# Patient Record
Sex: Female | Born: 1970 | Race: White | Hispanic: No | Marital: Married | Smoking: Never smoker
Health system: Southern US, Community
[De-identification: ages and names within clinical notes are randomized; demographics above are authoritative.]

## PROBLEM LIST (undated history)

## (undated) DIAGNOSIS — C801 Malignant (primary) neoplasm, unspecified: Secondary | ICD-10-CM

---

## 2003-01-23 HISTORY — PX: AUGMENTATION MAMMAPLASTY: SUR837

## 2010-02-10 ENCOUNTER — Emergency Department: Payer: Self-pay | Admitting: Internal Medicine

## 2012-12-18 DIAGNOSIS — R1031 Right lower quadrant pain: Secondary | ICD-10-CM | POA: Insufficient documentation

## 2012-12-18 DIAGNOSIS — J329 Chronic sinusitis, unspecified: Secondary | ICD-10-CM | POA: Insufficient documentation

## 2013-01-02 DIAGNOSIS — N920 Excessive and frequent menstruation with regular cycle: Secondary | ICD-10-CM | POA: Insufficient documentation

## 2013-01-02 DIAGNOSIS — R350 Frequency of micturition: Secondary | ICD-10-CM | POA: Insufficient documentation

## 2014-11-29 ENCOUNTER — Other Ambulatory Visit: Payer: Self-pay | Admitting: Family

## 2014-11-29 DIAGNOSIS — Z1231 Encounter for screening mammogram for malignant neoplasm of breast: Secondary | ICD-10-CM

## 2014-12-01 ENCOUNTER — Ambulatory Visit
Admission: RE | Admit: 2014-12-01 | Discharge: 2014-12-01 | Disposition: A | Discharge status: Home / Self Care | Payer: BLUE CROSS/BLUE SHIELD | Source: Ambulatory Visit | Attending: Family | Admitting: Family

## 2014-12-01 DIAGNOSIS — Z1231 Encounter for screening mammogram for malignant neoplasm of breast: Secondary | ICD-10-CM | POA: Diagnosis present

## 2014-12-01 HISTORY — DX: Malignant (primary) neoplasm, unspecified: C80.1

## 2015-10-19 DIAGNOSIS — E041 Nontoxic single thyroid nodule: Secondary | ICD-10-CM | POA: Insufficient documentation

## 2016-02-22 DIAGNOSIS — F39 Unspecified mood [affective] disorder: Secondary | ICD-10-CM | POA: Insufficient documentation

## 2016-08-29 ENCOUNTER — Encounter: Payer: Self-pay | Admitting: Sports Medicine

## 2016-08-29 ENCOUNTER — Ambulatory Visit (INDEPENDENT_AMBULATORY_CARE_PROVIDER_SITE_OTHER): Payer: BLUE CROSS/BLUE SHIELD | Admitting: Sports Medicine

## 2016-08-29 ENCOUNTER — Ambulatory Visit (INDEPENDENT_AMBULATORY_CARE_PROVIDER_SITE_OTHER): Payer: BLUE CROSS/BLUE SHIELD

## 2016-08-29 VITALS — Ht 61.0 in | Wt 150.0 lb

## 2016-08-29 DIAGNOSIS — S99922A Unspecified injury of left foot, initial encounter: Secondary | ICD-10-CM

## 2016-08-29 DIAGNOSIS — M79672 Pain in left foot: Secondary | ICD-10-CM | POA: Diagnosis not present

## 2016-08-29 DIAGNOSIS — M722 Plantar fascial fibromatosis: Secondary | ICD-10-CM

## 2016-08-29 MED ORDER — MELOXICAM 15 MG PO TABS: 15.0000 mg | ORAL_TABLET | Freq: Every day | ORAL | 0 refills | Status: DC

## 2016-08-29 NOTE — Progress Notes (Signed)
   Subjective:    Patient ID: Virginia Macias, female    DOB: 10-10-1970, 46 y.o.   MRN: [id number]  HPI  I dropped a chair on my left foot about 3 months ago then about 2 months later started having a lot of pain in the arch     Review of Systems  All other systems reviewed and are negative.      Objective:   Physical Exam        Assessment & Plan:

## 2016-08-29 NOTE — Patient Instructions (Signed)
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 Do exercises exactly as told by your health care provider and adjust them as directed. It is normal to feel mild stretching, pulling, tightness, or discomfort as you do these exercises, but you should stop right away if you feel sudden pain or your pain gets worse. Do not begin these exercises until told by your health care provider. Stretching and range of motion exercises These exercises warm up your muscles and joints and improve the movement and flexibility of your foot. These exercises also help to relieve pain. Exercise A: Plantar fascia stretch  1. Sit with your left / right leg crossed over your opposite knee. 2. Hold your heel with one hand with that thumb near your arch. With your other hand, hold your toes and gently pull them back toward the top of your foot. You should feel a stretch on the bottom of your toes or your foot or both. 3. Hold this stretch for__________ seconds. 4. Slowly release your toes and return to the starting position. Repeat __________ times. Complete this exercise __________ times a day. Exercise B: Gastroc, standing  1. Stand with your hands against a wall. 2. Extend your left / right leg behind you, and bend your front knee slightly. 3. Keeping your heels on the floor and keeping your back knee straight, shift your weight toward the wall without arching your back. You should feel a gentle stretch in your left / right calf. 4. Hold this position for __________ seconds. Repeat __________ times. Complete  this exercise __________ times a day. Exercise C: Soleus, standing 1. Stand with your hands against a wall. 2. Extend your left / right leg behind you, and bend your front knee slightly. 3. Keeping your heels on the floor, bend your back knee and slightly shift your weight over the back leg. You should feel a gentle stretch deep in your calf. 4. Hold this position for __________ seconds. Repeat __________ times. Complete this exercise __________ times a day. Exercise D: Gastrocsoleus, standing 1. Stand with the ball of your left / right foot on a step. The ball of your foot is on the walking surface, right under your toes. 2. Keep your other foot firmly on the same step. 3. Hold onto the wall or a railing for balance. 4. Slowly lift your other foot, allowing your body weight to press your heel down over the edge of the step. You should feel a stretch in your left / right calf. 5. Hold this position for __________ seconds. 6. Return both feet to the step. 7. Repeat this exercise with a slight bend in your left / right knee. Repeat __________ times with your left / right knee straight and __________ times with your left / right knee bent. Complete this exercise __________ times a day. Balance exercise This exercise builds your balance and strength control of your arch to help take pressure off your plantar fascia. Exercise E: Single leg stand 1. Without shoes, stand near a railing or in a doorway. You may hold onto  the railing or door frame as needed. 2. Stand on your left / right foot. Keep your big toe down on the floor and try to keep your arch lifted. Do not let your foot roll inward. 3. Hold this position for __________ seconds. 4. If this exercise is too easy, you can try it with your eyes closed or while standing on a pillow. Repeat __________ times. Complete this exercise __________ times a day. This information is not intended to replace advice given to you by your health care  provider. Make sure you discuss any questions you have with your health care provider. Document Released: 01/08/2005 Document Revised: 09/13/2015 Document Reviewed: 11/22/2014 Elsevier Interactive Patient Education  2018 [employer].

## 2016-08-29 NOTE — Progress Notes (Signed)
Subjective: Virginia Macias is a 46 y.o. female patient presents to office with complaint of heel pain on the left. Patient admits to post static dyskinesia for 1 month in duration. Patient states that this started after she dropped a chair on her foot. 3 months ago at her toe joint when this area is feeling better. However, has some pain back towards her heel. Patient has treated this problem with changing shoes without complete relief. Denies any other pedal complaints.   There are no active problems to display for this patient.   No current outpatient prescriptions on file prior to visit.   No current facility-administered medications on file prior to visit.     Allergies  Allergen Reactions  . Morphine Other (See Comments)    Migraine    Objective: Physical Exam General: The patient is alert and oriented x3 in no acute distress.  Dermatology: Skin is warm, dry and supple bilateral lower extremities. Nails 1-10 are normal. There is no erythema, edema, no eccymosis, no open lesions present. Integument is otherwise unremarkable.  Vascular: Dorsalis Pedis pulse and Posterior Tibial pulse are 2/4 bilateral. Capillary fill time is immediate to all digits.  Neurological: Grossly intact to light touch with an achilles reflex of +2/5 and a  negative Tinel's sign bilateral.  Musculoskeletal: No tenderness to left first metatarsophalangeal joint area where she dropped a chair. There is mild Tenderness to palpation at the medial calcaneal tubercale and through the insertion of the plantar fascia on the left foot specifically asked tendon starts to insert on the heel. No pain with compression of calcaneus bilateral. No pain with tuning fork to calcaneus bilateral. No pain with calf compression bilateral. There is decreased Ankle joint range of motion bilateral. All other joints range of motion within normal limits bilateral. Strength 5/5 in all groups bilateral.   Gait: Unassisted,  Antalgic avoid weight on left heel  Xray, Left foot:  Normal osseous mineralization. Joint spaces preserved. No fracture/dislocation/boney destruction. Minimal posterior Calcaneal spur present with mild thickening of plantar fascia. No other soft tissue abnormalities or radiopaque foreign bodies.   Assessment and Plan: Problem List Items Addressed This Visit    None    Visit Diagnoses    Plantar fasciitis    -  Primary   Relevant Medications   meloxicam (MOBIC) 15 MG tablet   Other Relevant Orders   DG Foot Complete Left   Injury of toe on left foot, initial encounter       Left foot pain          -Complete examination performed.  -Xrays reviewed -Discussed with patient in detail the condition of plantar fasciitis, Which likely came from compensating after she injured her foot with the chair 3 months prior, how this occurs and general treatment options. Explained both conservative and surgical treatments.  -Patient declined injection at this time -Rx Meloxicam to take with food or with GERD medicines -Recommended good supportive shoes and advised use of OTC insert. Explained to patient that if these orthoses work well, we will continue with these. If these do not improve her condition and  pain, we will consider custom molded orthoses. Shoe list was provided. -Explained and dispensed to patient daily stretching exercises. -Recommend patient to ice affected area 1-2x daily. -Patient to return to office as needed for follow up or sooner if problems or questions arise.  Landis Martins, DPM

## 2016-10-10 DIAGNOSIS — Z8249 Family history of ischemic heart disease and other diseases of the circulatory system: Secondary | ICD-10-CM | POA: Insufficient documentation

## 2017-05-08 ENCOUNTER — Ambulatory Visit: Payer: BLUE CROSS/BLUE SHIELD | Admitting: Sports Medicine

## 2017-06-20 IMAGING — MG MM DIGITAL SCREENING W/ IMPLANTS BILATERAL
9 series · 9 of 9 positions shown · non-contrast
Comparison: None

CLINICAL DATA: Screening.

EXAM:
DIGITAL SCREENING BILATERAL MAMMOGRAM WITH IMPLANTS AND CAD
The patient has retropectoral implants. Standard and implant
displaced views were performed.

[L MLO (1 of 2)]
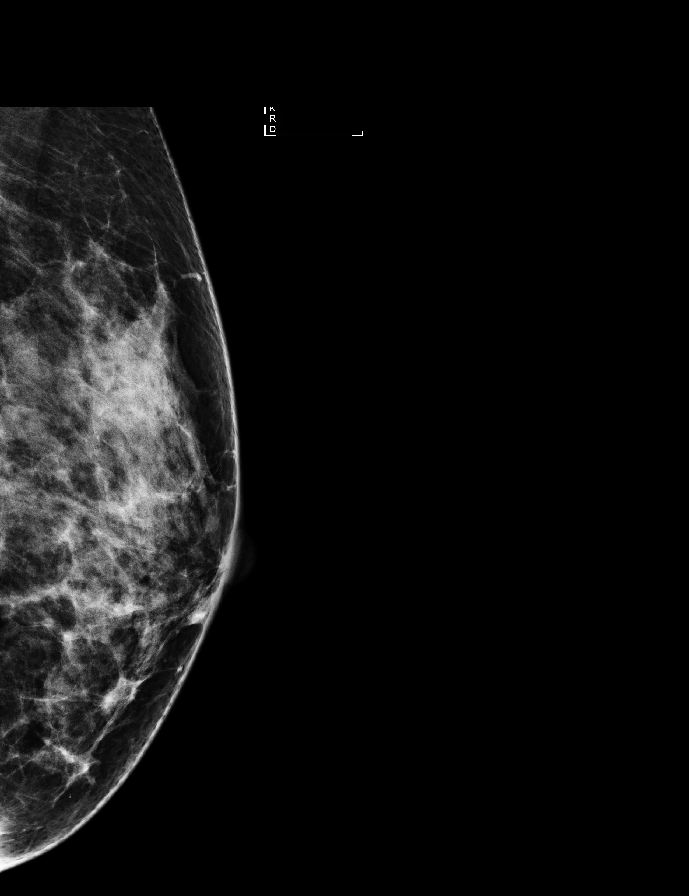

[L CC (1 of 2)]
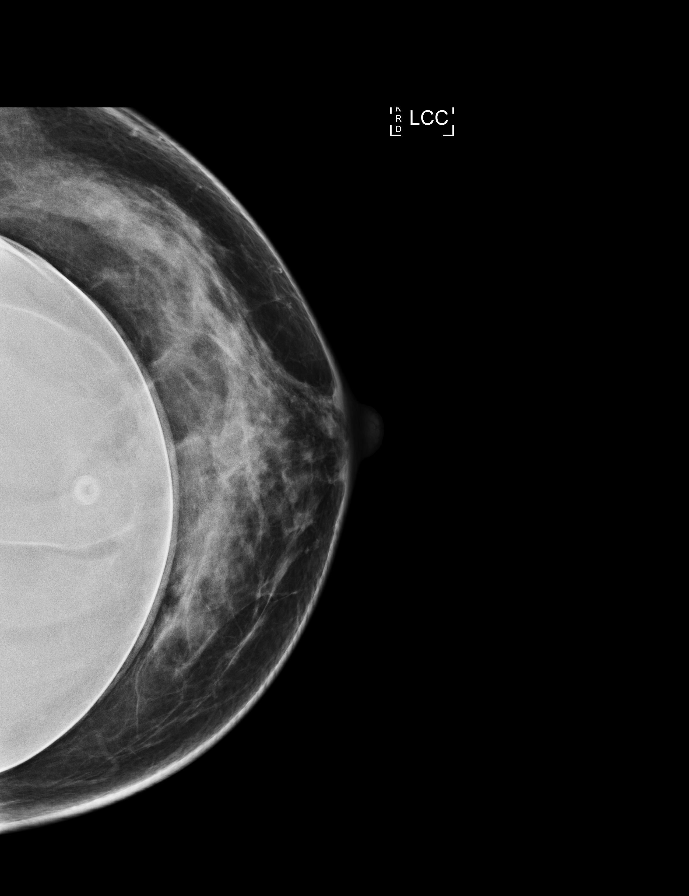

[L CC (2 of 2)]
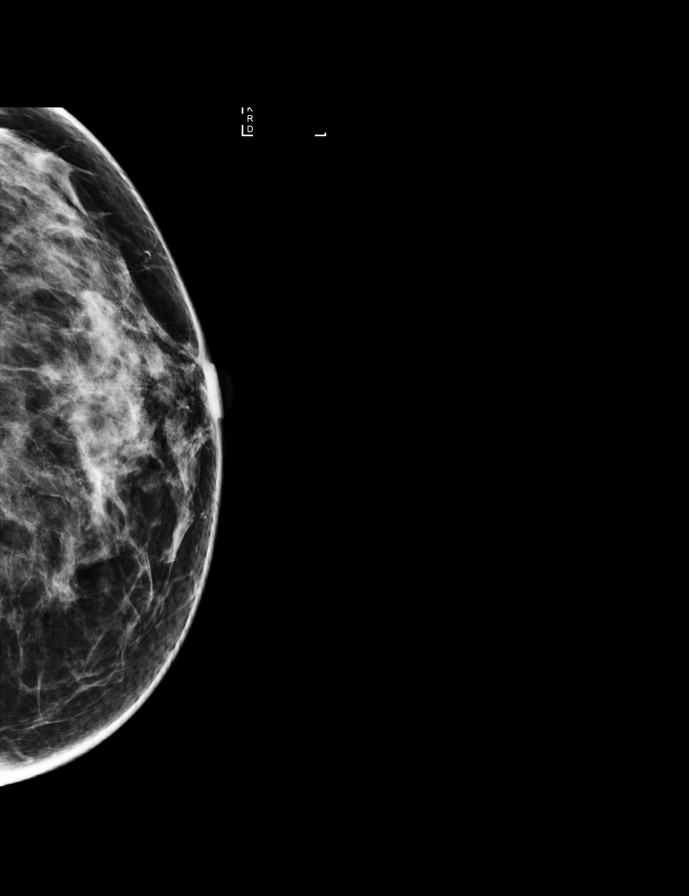

[R MLO (1 of 3)]
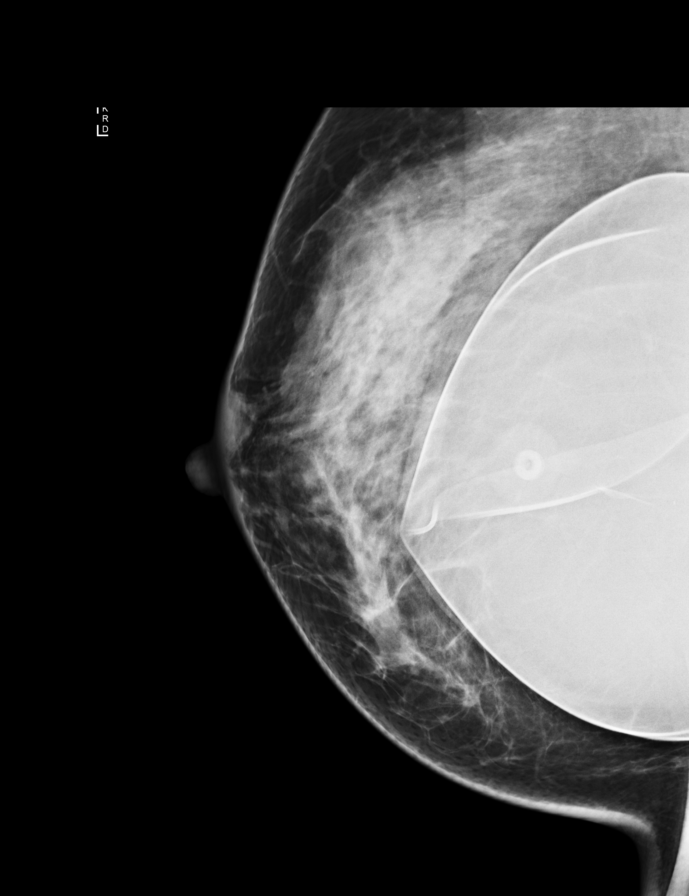

[L MLO (2 of 2)]
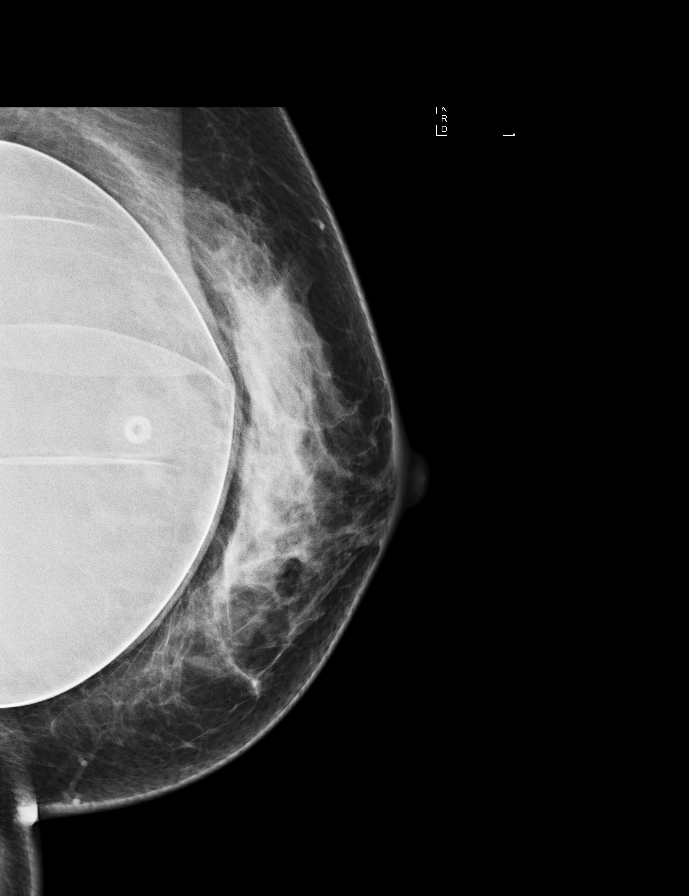

[R CC (1 of 2)]
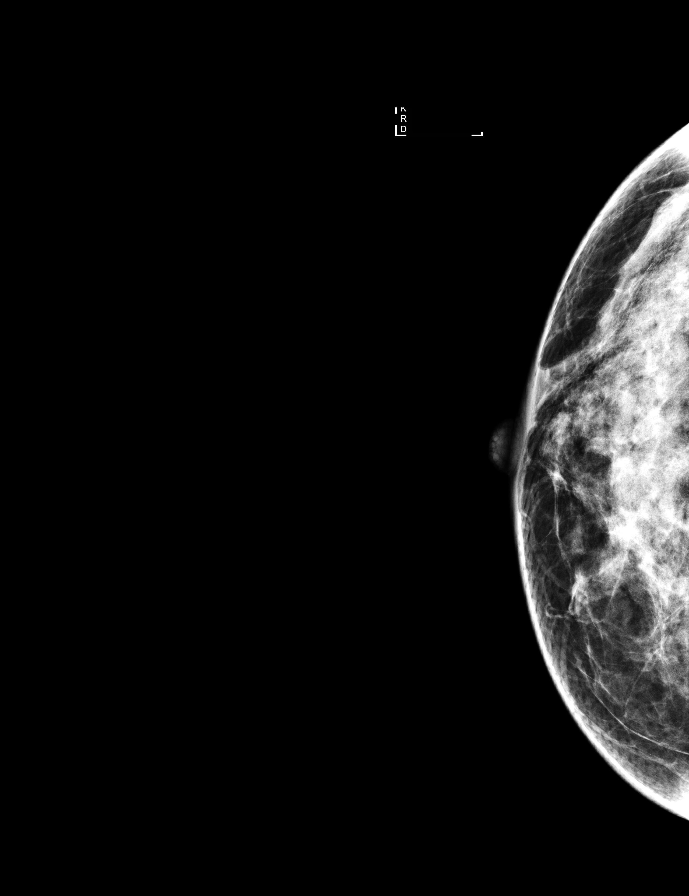

[R CC (2 of 2)]
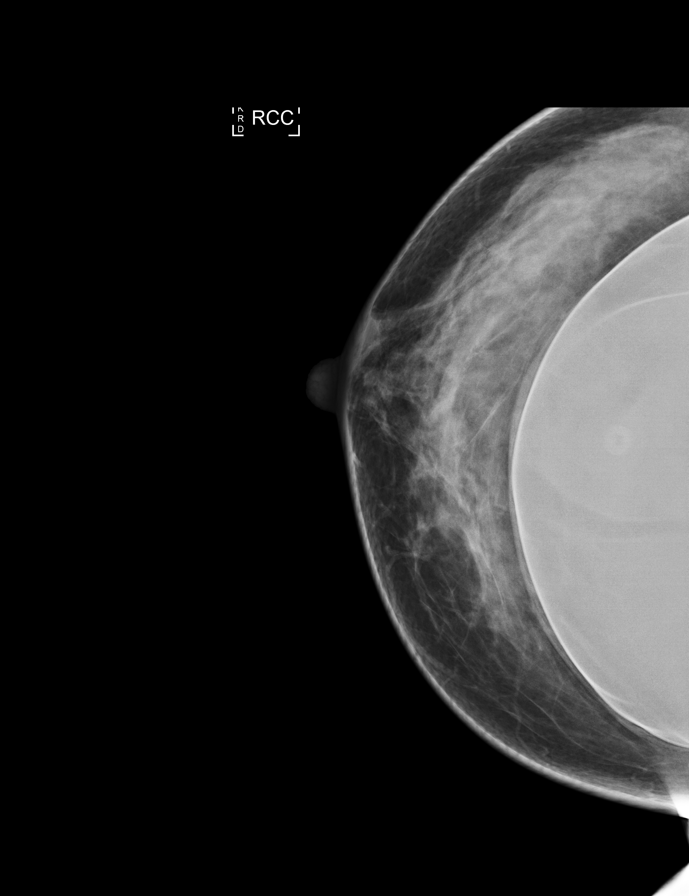

[R MLO (2 of 3)]
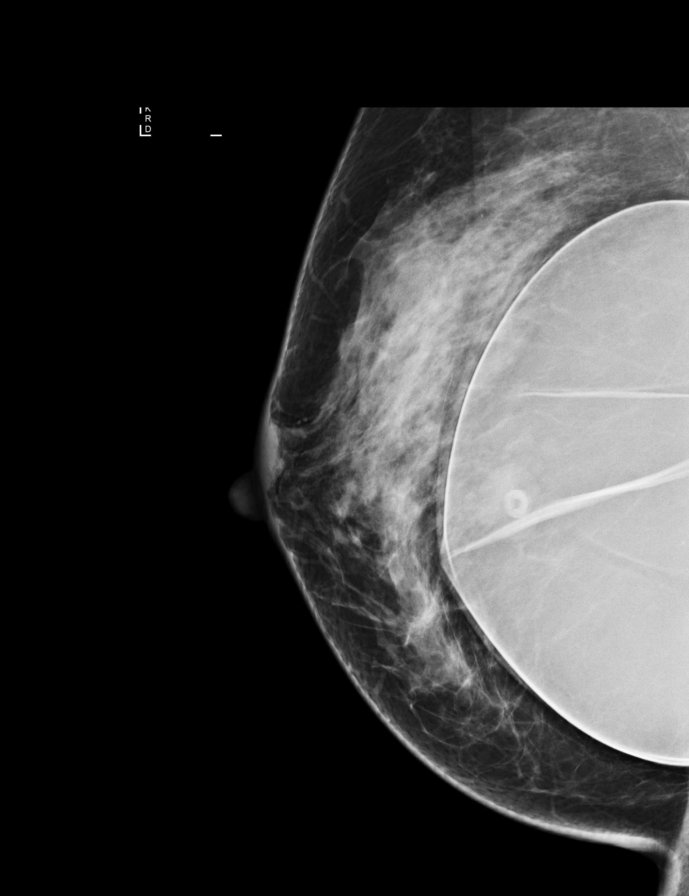

[R MLO (3 of 3)]
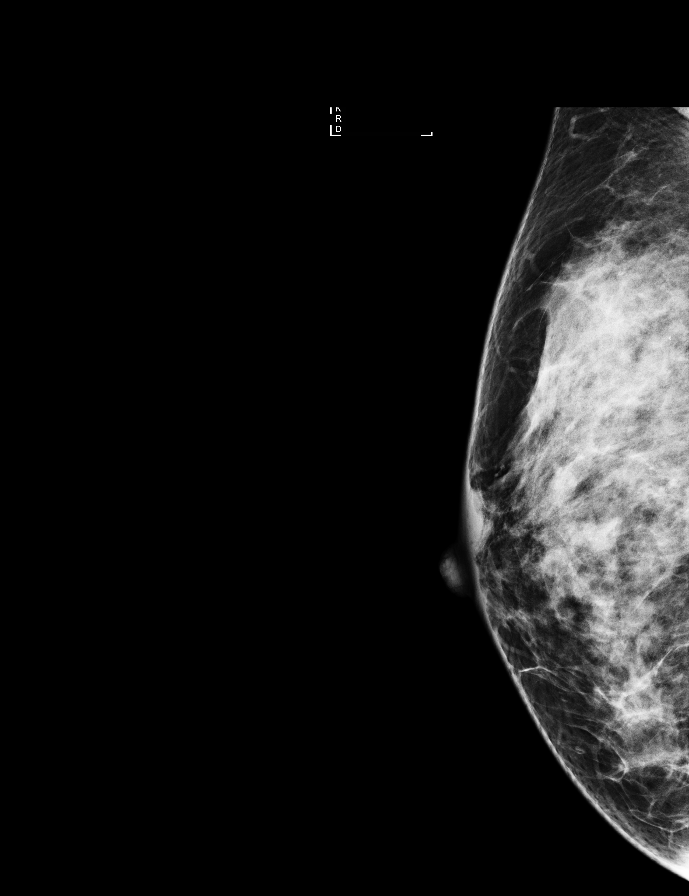

[9 of 9 positions shown; findings below may reference images not displayed]

ACR Breast Density Category c: The breast tissue is heterogeneously
dense, which may obscure small masses.
FINDINGS: There are no findings suspicious for malignancy. Images were
processed with CAD.
IMPRESSION: No mammographic evidence of malignancy. A result letter of this
screening mammogram will be mailed directly to the patient.

RECOMMENDATION:
Screening mammogram in one year. (Code:W9-1-DHA)

BI-RADS CATEGORY  1:  Negative.

## 2017-07-04 DIAGNOSIS — F419 Anxiety disorder, unspecified: Secondary | ICD-10-CM | POA: Insufficient documentation

## 2017-10-30 ENCOUNTER — Ambulatory Visit: Payer: BLUE CROSS/BLUE SHIELD | Admitting: Sports Medicine

## 2017-10-30 ENCOUNTER — Encounter: Payer: Self-pay | Admitting: Sports Medicine

## 2017-10-30 ENCOUNTER — Ambulatory Visit (INDEPENDENT_AMBULATORY_CARE_PROVIDER_SITE_OTHER): Payer: BLUE CROSS/BLUE SHIELD

## 2017-10-30 DIAGNOSIS — M2042 Other hammer toe(s) (acquired), left foot: Secondary | ICD-10-CM | POA: Diagnosis not present

## 2017-10-30 DIAGNOSIS — M79672 Pain in left foot: Secondary | ICD-10-CM | POA: Diagnosis not present

## 2017-10-30 DIAGNOSIS — M6740 Ganglion, unspecified site: Secondary | ICD-10-CM | POA: Diagnosis not present

## 2017-10-30 NOTE — Progress Notes (Signed)
Subjective: Virginia Macias is a 47 y.o. female patient who presents to office for evaluation of Left foot pain. Patient complains of progressive pain especially over the last few months at the left second toe that is a dull achy pain 3 out of 10 states that one month the small lesion over the toe popped and clear drainage came out states that sometimes the area gets red otherwise states that it is only worsened with direct pressure and denies any other treatment states that she allowed her dermatologist to see this lesion and was diagnosed as a cyst on the toe and patient reports the office to discuss treatment options for the cyst.  Patient denies any other pedal complaints.   There are no active problems to display for this patient.   Current Outpatient Medications on File Prior to Visit  Medication Sig Dispense Refill  . cetirizine (ZYRTEC) 5 MG tablet Take 5 mg by mouth.    . escitalopram (LEXAPRO) 20 MG tablet Take 20 mg by mouth.    . esomeprazole (NEXIUM) 20 MG capsule Take 20 mg by mouth.     No current facility-administered medications on file prior to visit.     Allergies  Allergen Reactions  . Fire Ant Rash and Swelling  . Silicone Hives  . Morphine Other (See Comments)    Migraine    Objective:  General: Alert and oriented x3 in no acute distress  Dermatology: Small brace translucent soft tissue mass with fluctuance consistent with cysts at the distal interphalangeal joint of the left second toe. No open lesions bilateral lower extremities, no webspace macerations, no ecchymosis bilateral, all nails x 10 are well manicured.  Vascular: Dorsalis Pedis and Posterior Tibial pedal pulses 2/4, Capillary Fill Time 3 seconds,(+) pedal hair growth bilateral, no edema bilateral lower extremities, Temperature gradient within normal limits.  Neurology: Johney Maine sensation intact via light touch bilateral..   Musculoskeletal: Semi-flexible hammertoes 2-5 with Mild tenderness with  palpation at DIPJ left second toe.  Ankle, Subtalar, Midtarsal, and MTPJ joint range of motion is within normal limits, there is no 1st ray hypermobility noted bilateral, No bunion deformity noted bilateral. No pain with calf compression bilateral.  Admits that her plantar fascia on her left is resolved and sometimes has symptoms on the right however nothing reproducible on today's visit.  Strength within normal limits in all groups bilateral.   Gait: Unassisted, Non-antalgic.  Xrays  Left foot    Impression: Mild contracture of toes consistent with hammertoe deformity no other acute findings.       Assessment and Plan: Problem List Items Addressed This Visit    None    Visit Diagnoses    Left foot pain    -  Primary   Relevant Orders   DG Foot Complete Left   Mucoid cyst, joint       Hammertoe of left foot           -Complete examination performed -Xrays reviewed -Discussed treatement options for mucoid cyst with hammertoe -Patient opt for surgical management. Consent obtained for excision of cyst and fusion of left second distal interphalangeal joint with K wire. Pre and Post op course explained. Risks, benefits, alternatives explained. No guarantees given or implied. Surgical booking slip submitted and provided patient with Surgical packet and info for [facility] -To dispense postop shoe at surgical center -Patient to return to office after surgery or sooner if condition worsens.  Landis Martins, DPM

## 2017-10-30 NOTE — Patient Instructions (Signed)
Pre-Operative Instructions  Congratulations, you have decided to take an important step towards improving your quality of life.  You can be assured that the doctors and staff at [facility] will be with you every step of the way.  Here are some important things you should know:  1. Plan to be at the surgery center/hospital at least 1 (one) hour prior to your scheduled time, unless otherwise directed by the surgical center/hospital staff.  You must have a responsible adult accompany you, remain during the surgery and drive you home.  Make sure you have directions to the surgical center/hospital to ensure you arrive on time. 2. If you are having surgery at [facility] or [facility] hospitals, you will need a copy of your medical history and physical form from your family physician within one month prior to the date of surgery. We will give you a form for your primary physician to complete.  3. We make every effort to accommodate the date you request for surgery.  However, there are times where surgery dates or times have to be moved.  We will contact you as soon as possible if a change in schedule is required.   4. No aspirin/ibuprofen for one week before surgery.  If you are on aspirin, any non-steroidal anti-inflammatory medications (Mobic, Aleve, Ibuprofen) should not be taken seven (7) days prior to your surgery.  You make take Tylenol for pain prior to surgery.  5. Medications - If you are taking daily heart and blood pressure medications, seizure, reflux, allergy, asthma, anxiety, pain or diabetes medications, make sure you notify the surgery center/hospital before the day of surgery so they can tell you which medications you should take or avoid the day of surgery. 6. No food or drink after midnight the night before surgery unless directed otherwise by surgical center/hospital staff. 7. No alcoholic beverages 24-hours prior to surgery.  No smoking 24-hours prior or 24-hours after  surgery. 8. Wear loose pants or shorts. They should be loose enough to fit over bandages, boots, and casts. 9. Don't wear slip-on shoes. Sneakers are preferred. 10. Bring your boot with you to the surgery center/hospital.  Also bring crutches or a walker if your physician has prescribed it for you.  If you do not have this equipment, it will be provided for you after surgery. 11. If you have not been contacted by the surgery center/hospital by the day before your surgery, call to confirm the date and time of your surgery. 12. Leave-time from work may vary depending on the type of surgery you have.  Appropriate arrangements should be made prior to surgery with your employer. 13. Prescriptions will be provided immediately following surgery by your doctor.  Fill these as soon as possible after surgery and take the medication as directed. Pain medications will not be refilled on weekends and must be approved by the doctor. 14. Remove nail polish on the operative foot and avoid getting pedicures prior to surgery. 15. Wash the night before surgery.  The night before surgery wash the foot and leg well with water and the antibacterial soap provided. Be sure to pay special attention to beneath the toenails and in between the toes.  Wash for at least three (3) minutes. Rinse thoroughly with water and dry well with a towel.  Perform this wash unless told not to do so by your physician.  Enclosed: 1 Ice pack (please put in freezer the night before surgery)   1 Hibiclens skin cleaner     Pre-op instructions  If you have any questions regarding the instructions, please do not hesitate to call our office.  [place]: [address], [place], [place] [address] -- [phone]  [place]: [address] [place], [place] [address] -- [phone]  [place]: [address]  [place], [place] [address] -- [phone]  [place]: [address], [address], [place], [place] [address] -- [phone]  Website:  [email or web address] 

## 2017-10-31 ENCOUNTER — Telehealth: Payer: Self-pay | Admitting: *Deleted

## 2017-10-31 NOTE — Telephone Encounter (Signed)
"  I need to schedule a surgery date.  You can call me back at [phone].  Thanks, have a great day."

## 2017-11-01 NOTE — Telephone Encounter (Signed)
I am returning your call.  You want to schedule your surgery with Dr. Cannon Kettle.  "Yes, she told me to call you to set that up."  When would you like to schedule it, she does surgeries on Mondays.  "I'd like to do it on her next available."  She can do it on October 21.  "That's perfect.  What time will I need to be there?"  Someone from the surgical center will give you a call the Friday before your surgery date.  They will give you your arrival time.  "Do I need to give them my insurance information?"  They may ask for it when you register with them and I will send them the information as well.  "Great, thank you so much."

## 2017-11-04 ENCOUNTER — Other Ambulatory Visit: Payer: Self-pay | Admitting: Sports Medicine

## 2017-11-04 DIAGNOSIS — M2042 Other hammer toe(s) (acquired), left foot: Secondary | ICD-10-CM

## 2017-11-04 DIAGNOSIS — M6740 Ganglion, unspecified site: Secondary | ICD-10-CM

## 2017-11-04 DIAGNOSIS — M79672 Pain in left foot: Secondary | ICD-10-CM

## 2017-11-11 ENCOUNTER — Encounter: Payer: Self-pay | Admitting: Sports Medicine

## 2017-11-11 DIAGNOSIS — M6740 Ganglion, unspecified site: Secondary | ICD-10-CM | POA: Diagnosis not present

## 2017-11-11 DIAGNOSIS — M2042 Other hammer toe(s) (acquired), left foot: Secondary | ICD-10-CM

## 2017-11-12 ENCOUNTER — Telehealth: Payer: Self-pay | Admitting: Sports Medicine

## 2017-11-12 NOTE — Telephone Encounter (Signed)
Post op check phone call made to patient. Patient did not answer. Left voicemail for patient to call off if there are any post op concerns -Dr. Cannon Kettle

## 2017-11-20 ENCOUNTER — Encounter: Payer: Self-pay | Admitting: Sports Medicine

## 2017-11-21 ENCOUNTER — Ambulatory Visit (INDEPENDENT_AMBULATORY_CARE_PROVIDER_SITE_OTHER): Payer: BLUE CROSS/BLUE SHIELD | Admitting: Sports Medicine

## 2017-11-21 ENCOUNTER — Ambulatory Visit (INDEPENDENT_AMBULATORY_CARE_PROVIDER_SITE_OTHER): Payer: BLUE CROSS/BLUE SHIELD

## 2017-11-21 ENCOUNTER — Encounter: Payer: Self-pay | Admitting: Sports Medicine

## 2017-11-21 VITALS — BP 115/81 | HR 72 | Temp 98.1°F | Resp 16

## 2017-11-21 DIAGNOSIS — M6740 Ganglion, unspecified site: Secondary | ICD-10-CM

## 2017-11-21 DIAGNOSIS — M2042 Other hammer toe(s) (acquired), left foot: Secondary | ICD-10-CM | POA: Diagnosis not present

## 2017-11-21 DIAGNOSIS — Z9889 Other specified postprocedural states: Secondary | ICD-10-CM

## 2017-11-21 NOTE — Progress Notes (Signed)
Subjective: Virginia Macias is a 47 y.o. female patient seen today in office for POV #1 (DOS 11-11-17), S/P excision of mucoid cyst at left second toe with hammertoe distal interphalangeal joint fusion with K wire. Patient denies pain at surgical site, denies calf pain, denies headache, chest pain, shortness of breath, nausea, vomiting, fever, or chills. Patient states that she took her dressing off and is also been showering and states that she did not know she was not supposed to shower and get the foot wet. No other issues noted.   Patient Active Problem List   Diagnosis Date Noted  . Anxiety 07/04/2017  . Family history of coronary artery disease occurring prior to 48 years of age 40/19/2018  . Mood disorder in full remission ([place]) 02/22/2016  . Thyroid nodule 10/19/2015  . Increased frequency of urination 01/02/2013  . Menorrhagia 01/02/2013  . Chronic sinusitis 12/18/2012  . Right lower quadrant pain 12/18/2012    Current Outpatient Medications on File Prior to Visit  Medication Sig Dispense Refill  . cetirizine (ZYRTEC) 5 MG tablet Take 5 mg by mouth.    . EPINEPHrine 0.3 mg/0.3 mL IJ SOAJ injection Inject into the muscle.    . escitalopram (LEXAPRO) 20 MG tablet Take 20 mg by mouth.    . esomeprazole (NEXIUM) 20 MG capsule Take 20 mg by mouth.    . estradiol (VIVELLE-DOT) 0.05 MG/24HR patch Place onto the skin.    Marland Kitchen gabapentin (NEURONTIN) 100 MG capsule Take by mouth.     No current facility-administered medications on file prior to visit.     Allergies  Allergen Reactions  . Fire Ant Rash and Swelling  . Silicone Hives  . Morphine Other (See Comments)    Migraine  . Tape     Objective: There were no vitals filed for this visit.  General: No acute distress, AAOx3  Left foot: Sutures and K wire intact with no gapping or dehiscence at surgical site, mild swelling to second toe, no erythema, no warmth, no drainage, no signs of infection noted, Capillary fill time <3  seconds in all digits, gross sensation present via light touch to left foot. No pain or crepitation with range of motion left foot.  No pain with calf compression.   Post Op Xray, left second toe and excellent alignment and position. Osteotomy site healing. Hardware intact. Soft tissue swelling within normal limits for post op status.   Assessment and Plan:  Problem List Items Addressed This Visit    None    Visit Diagnoses    Post-operative state    -  Primary   Relevant Orders   DG Foot Complete Left   Hammertoe of left foot       Relevant Orders   DG Foot Complete Left   Mucoid cyst, joint       Relevant Orders   DG Foot Complete Left       -Patient seen and evaluated -X-rays reviewed -Applied dry sterile dressing to surgical site left foot secured with Coban and stockinette -Advised patient to make sure to keep dressings clean, dry, and intact to left surgical site and continue with cam boot at any time she is attempting to walk -Advised patient to limit activity to necessity  -Advised patient to ice and elevate as necessary  -Encourage patient compliance with postoperative instructions and reminded patient that we gave her a printout and I verbally told her of these instructions of which she should comply with and must maintain  wearing the boot and keeping her dressings clean dry and intact until next visit -Will plan for suture removal at next office visit. In the meantime, patient to call office if any issues or problems arise.   Landis Martins, DPM

## 2017-11-28 ENCOUNTER — Ambulatory Visit (INDEPENDENT_AMBULATORY_CARE_PROVIDER_SITE_OTHER): Payer: BLUE CROSS/BLUE SHIELD | Admitting: Sports Medicine

## 2017-11-28 ENCOUNTER — Encounter: Payer: Self-pay | Admitting: Sports Medicine

## 2017-11-28 VITALS — BP 104/81 | HR 66 | Temp 98.3°F | Resp 16

## 2017-11-28 DIAGNOSIS — M6740 Ganglion, unspecified site: Secondary | ICD-10-CM

## 2017-11-28 DIAGNOSIS — M2042 Other hammer toe(s) (acquired), left foot: Secondary | ICD-10-CM

## 2017-11-28 DIAGNOSIS — Z9889 Other specified postprocedural states: Secondary | ICD-10-CM

## 2017-11-28 DIAGNOSIS — M79672 Pain in left foot: Secondary | ICD-10-CM

## 2017-11-28 NOTE — Progress Notes (Signed)
Subjective: Virginia Macias is a 47 y.o. female patient seen today in office for POV #2 (DOS 11-11-17), S/P excision of mucoid cyst at left second toe with hammertoe distal interphalangeal joint fusion with K wire. Patient denies pain at surgical site but does state that she gets some pain to the ball of her foot when she is walking around the house barefoot states that the pain sometimes with pressure is 5 out of 10 but otherwise it is doing fine.  Patient comes office today with no dressing on and has removed it and reports that she constantly picks at her foot and has twisted her pin, denies calf pain, denies headache, chest pain, shortness of breath, nausea, vomiting, fever, or chills. No other issues noted.   Patient Active Problem List   Diagnosis Date Noted  . Anxiety 07/04/2017  . Family history of coronary artery disease occurring prior to 47 years of age 89/19/2018  . 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 Thyroid nodule 10/19/2015  . Increased frequency of urination 01/02/2013  . Menorrhagia 01/02/2013  . Chronic sinusitis 12/18/2012  . Right lower quadrant pain 12/18/2012    Current Outpatient Medications on File Prior to Visit  Medication Sig Dispense Refill  . cetirizine (ZYRTEC) 5 MG tablet Take 5 mg by mouth.    . EPINEPHrine 0.3 mg/0.3 mL IJ SOAJ injection Inject into the muscle.    . escitalopram (LEXAPRO) 20 MG tablet Take 20 mg by mouth.    . esomeprazole (NEXIUM) 20 MG capsule Take 20 mg by mouth.    . estradiol (VIVELLE-DOT) 0.05 MG/24HR patch Place onto the skin.    Marland Kitchen gabapentin (NEURONTIN) 100 MG capsule Take by mouth.     No current facility-administered medications on file prior to visit.     Allergies  Allergen Reactions  . Fire Ant Rash and Swelling  . Silicone Hives  . Morphine Other (See Comments)    Migraine  . Tape     Objective: There were no vitals filed for this visit.  General: No acute distress, AAOx3  Left foot: Sutures  and K wire intact with no gapping or dehiscence at surgical site, mild swelling to second toe, no erythema, no warmth, no drainage, no signs of infection noted, Capillary fill time <3 seconds in all digits, gross sensation present via light touch to left foot. No pain or crepitation with range of motion left foot.  No pain with calf compression.   Assessment and Plan:  Problem List Items Addressed This Visit    None    Visit Diagnoses    Post-operative state    -  Primary   Hammertoe of left foot       Mucoid cyst, joint       Left foot pain          -Patient seen and evaluated -Sutures removed -Applied dry sterile dressing to surgical site left foot secured with Coban and stockinette -Advised patient to make sure to keep dressings clean, dry, and intact to left surgical site and continue with cam boot at any time she is attempting to walk like previous and strongly advised patient the risk of complications if she is not compliant with my postoperative instructions -Advised patient to limit activity to necessity  -Advised patient to ice and elevate as necessary  -I verbally stressed to patient again compliance with wearing her boot anytime she is walking and standing and to refrain from removing her dressing getting her foot  wet or tampering with the pain at the toe -Will plan for x-ray and possible K wire removal at next office visit. In the meantime, patient to call office if any issues or problems arise.   Landis Martins, DPM

## 2017-12-04 ENCOUNTER — Encounter: Payer: Self-pay | Admitting: Sports Medicine

## 2017-12-04 ENCOUNTER — Ambulatory Visit (INDEPENDENT_AMBULATORY_CARE_PROVIDER_SITE_OTHER): Payer: BLUE CROSS/BLUE SHIELD | Admitting: Sports Medicine

## 2017-12-04 ENCOUNTER — Ambulatory Visit (INDEPENDENT_AMBULATORY_CARE_PROVIDER_SITE_OTHER): Payer: BLUE CROSS/BLUE SHIELD

## 2017-12-04 ENCOUNTER — Other Ambulatory Visit: Payer: Self-pay | Admitting: Sports Medicine

## 2017-12-04 VITALS — BP 120/71 | HR 80 | Temp 97.0°F | Resp 16

## 2017-12-04 DIAGNOSIS — M2042 Other hammer toe(s) (acquired), left foot: Secondary | ICD-10-CM

## 2017-12-04 DIAGNOSIS — Z9889 Other specified postprocedural states: Secondary | ICD-10-CM

## 2017-12-04 DIAGNOSIS — M79672 Pain in left foot: Secondary | ICD-10-CM

## 2017-12-04 DIAGNOSIS — M6740 Ganglion, unspecified site: Secondary | ICD-10-CM

## 2017-12-04 NOTE — Progress Notes (Signed)
Subjective: Virginia Macias is a 47 y.o. female patient seen today in office for POV #3 (DOS 11-11-17), S/P excision of mucoid cyst at left second toe with hammertoe distal interphalangeal joint fusion with K wire. Patient admits a little pain at surgical site  4 out of 10 but otherwise it is doing fine.  Patient comes office today with no dressing on and in a normal shoe, denies calf pain, denies headache, chest pain, shortness of breath, nausea, vomiting, fever, or chills. No other issues noted.   Patient Active Problem List   Diagnosis Date Noted  . Anxiety 07/04/2017  . Family history of coronary artery disease occurring prior to 47 years of age 22/19/2018  . 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 Thyroid nodule 10/19/2015  . Increased frequency of urination 01/02/2013  . Menorrhagia 01/02/2013  . Chronic sinusitis 12/18/2012  . Right lower quadrant pain 12/18/2012    Current Outpatient Medications on File Prior to Visit  Medication Sig Dispense Refill  . cetirizine (ZYRTEC) 5 MG tablet Take 5 mg by mouth.    . EPINEPHrine 0.3 mg/0.3 mL IJ SOAJ injection Inject into the muscle.    . escitalopram (LEXAPRO) 20 MG tablet Take 20 mg by mouth.    . esomeprazole (NEXIUM) 20 MG capsule Take 20 mg by mouth.    . estradiol (VIVELLE-DOT) 0.05 MG/24HR patch Place onto the skin.    Marland Kitchen gabapentin (NEURONTIN) 100 MG capsule Take by mouth.     No current facility-administered medications on file prior to visit.     Allergies  Allergen Reactions  . Fire Ant Rash and Swelling  . Silicone Hives  . Morphine Other (See Comments)    Migraine  . Tape     Objective: There were no vitals filed for this visit.  General: No acute distress, AAOx3  Left foot:K wire intact with no gapping or dehiscence at surgical site, mild swelling to second toe, no erythema, no warmth, no drainage, no signs of infection noted, Capillary fill time <3 seconds in all digits, gross sensation present  via light touch to left foot. No pain or crepitation with range of motion left foot.  No pain with calf compression.   Xray: Pin appears to be shifted in soft tissue and ready for removal at toe  Assessment and Plan:  Problem List Items Addressed This Visit    None    Visit Diagnoses    Post-operative state    -  Primary   Relevant Orders   DG Foot Complete Left   Hammertoe of left foot       Relevant Orders   DG Foot Complete Left   Mucoid cyst, joint       Left foot pain          -Patient seen and evaluated -Kwire removed -Patient now may shower and return to normal shoe -After a few weeks may use scar cream/getls and encouraged gentle range of motion exercises -Advised patient to limit activity to tolerance -Will plan for xray and final post op check at next office visit. In the meantime, patient to call office if any issues or problems arise.   Landis Martins, DPM

## 2017-12-05 ENCOUNTER — Telehealth: Payer: Self-pay | Admitting: Sports Medicine

## 2017-12-05 NOTE — Telephone Encounter (Signed)
Pt states since the pin was remove the toe has throbbed and is hot and red and it feels like right at the toenail is lifting off and there is a crust around the nail. I told pt I would like for her to come to the [facility] office tomorrow, to cover the area around the nail and the tip with antibiotic ointment and a bandaid, that if the symptoms worsened or fever, red streaks then go to the ED. Pt states understanding and I transferred to schedulers.

## 2017-12-05 NOTE — Telephone Encounter (Signed)
Patient had a pin removed from her toe yesterday and is calling with questions regarding redness and aching of the toe where the pin was.  Can you give the patient a call please.

## 2017-12-06 ENCOUNTER — Ambulatory Visit (INDEPENDENT_AMBULATORY_CARE_PROVIDER_SITE_OTHER): Payer: BLUE CROSS/BLUE SHIELD | Admitting: Podiatry

## 2017-12-06 ENCOUNTER — Ambulatory Visit (INDEPENDENT_AMBULATORY_CARE_PROVIDER_SITE_OTHER): Payer: BLUE CROSS/BLUE SHIELD

## 2017-12-06 ENCOUNTER — Encounter: Payer: Self-pay | Admitting: Podiatry

## 2017-12-06 VITALS — BP 138/84 | HR 83 | Temp 98.3°F | Resp 16

## 2017-12-06 DIAGNOSIS — L03032 Cellulitis of left toe: Secondary | ICD-10-CM

## 2017-12-06 DIAGNOSIS — M2042 Other hammer toe(s) (acquired), left foot: Secondary | ICD-10-CM | POA: Diagnosis not present

## 2017-12-06 DIAGNOSIS — L02612 Cutaneous abscess of left foot: Secondary | ICD-10-CM

## 2017-12-06 DIAGNOSIS — Z9889 Other specified postprocedural states: Secondary | ICD-10-CM

## 2017-12-06 MED ORDER — CIPROFLOXACIN HCL 500 MG PO TABS: 500.0000 mg | ORAL_TABLET | Freq: Two times a day (BID) | ORAL | 0 refills | Status: AC

## 2017-12-06 MED ORDER — CLINDAMYCIN HCL 300 MG PO CAPS: 300.0000 mg | ORAL_CAPSULE | Freq: Three times a day (TID) | ORAL | 0 refills | Status: AC

## 2017-12-10 ENCOUNTER — Telehealth: Payer: Self-pay | Admitting: Sports Medicine

## 2017-12-10 NOTE — Telephone Encounter (Signed)
Phone call made to patient to check on her to see how her toe was doing. Patient was seen by Dr. Jacqualyn Posey on 11/15 for infection at toe after removal of kwire. Patient's husband answered the phone and reports that her toe is doing a lot better and states that she is taking her antibiotics with no problems or issues. I reminded husband that wife is to see me for follow up in [place] on 11/21. He expressed understanding and said he will let his wife know that I called. -Dr. Cannon Kettle

## 2017-12-10 NOTE — Progress Notes (Signed)
Subjective: Virginia Macias is a 47 y.o. is seen today in office s/p left 2nd hammertoe repair preformed on 11/11/2017 with Dr. Cannon Kettle.  She states that she had the K wire removed on Wednesday and that she was active on Wednesday night and then she started to notice increased redness and pain and swelling and some drainage.  She has had a low-grade fever.  She denies any recent injury or trauma otherwise.  Objective: General: No acute distress, AAOx3  DP/PT pulses palpable 2/4, CRT < 3 sec to all digits.  Protective sensation intact. Motor function intact.  LEFT foot: Incision is well coapted without any evidence of dehiscence, there is some clear drainage coming from the incision site.  There is edema erythema to the toe but there is no ascending cellulitis.  There is tenderness palpation the surgical site.  There is no fluctuation or crepitation malodor. No other areas of tenderness to bilateral lower extremities.  No other open lesions or pre-ulcerative lesions.  No pain with calf compression, swelling, warmth, erythema.       Assessment and Plan:  Status post left foot hammertoe repair with cellulitis   -Treatment options discussed including all alternatives, risks, and complications -X-rays were obtained reviewed.  No definitive evidence of osteomyelitis at this time there is postsurgical changes. -Small amount of antibiotic ointment dressing changes daily.  Prescribed clindamycin and ciprofloxacin. -Remain in cam boot and limit activity. -Ice/elevation -Pain medication as needed. -Monitor for any clinical signs or symptoms of infection and DVT/PE and directed to call the office immediately should any occur or go to the ER. -Follow-up next week with Dr. Cannon Kettle or sooner if any problems arise. In the meantime, encouraged to call the office with any questions, concerns, change in symptoms.   Celesta Gentile, DPM

## 2017-12-11 ENCOUNTER — Encounter: Payer: Self-pay | Admitting: Sports Medicine

## 2017-12-11 ENCOUNTER — Ambulatory Visit (INDEPENDENT_AMBULATORY_CARE_PROVIDER_SITE_OTHER): Payer: BLUE CROSS/BLUE SHIELD | Admitting: Sports Medicine

## 2017-12-11 VITALS — BP 126/78 | HR 77 | Resp 16

## 2017-12-11 DIAGNOSIS — Z9889 Other specified postprocedural states: Secondary | ICD-10-CM

## 2017-12-11 DIAGNOSIS — L02612 Cutaneous abscess of left foot: Secondary | ICD-10-CM

## 2017-12-11 DIAGNOSIS — L03032 Cellulitis of left toe: Secondary | ICD-10-CM

## 2017-12-11 DIAGNOSIS — M2042 Other hammer toe(s) (acquired), left foot: Secondary | ICD-10-CM

## 2017-12-11 MED ORDER — MUPIROCIN 2 % EX OINT: TOPICAL_OINTMENT | CUTANEOUS | 0 refills | Status: AC

## 2017-12-11 NOTE — Progress Notes (Signed)
Subjective: Virginia Macias is a 47 y.o. female patient seen today in office for POV #5 (DOS 11-11-17), S/P excision of mucoid cyst at left second toe with hammertoe distal interphalangeal joint fusion with K wire. Patient is currently on Clinda and Cipro for cellulitis of the toe and states that toe is feeling much better has been compliant with using her boot and applying some Bactroban cream to the toe itself.  Patient denies calf pain, denies headache, chest pain, shortness of breath, nausea, vomiting, fever, or chills. No other issues noted.   Patient Active Problem List   Diagnosis Date Noted  . Anxiety 07/04/2017  . Family history of coronary artery disease occurring prior to 47 years of age 43/19/2018  . 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 Thyroid nodule 10/19/2015  . Increased frequency of urination 01/02/2013  . Menorrhagia 01/02/2013  . Chronic sinusitis 12/18/2012  . Right lower quadrant pain 12/18/2012    Current Outpatient Medications on File Prior to Visit  Medication Sig Dispense Refill  . cetirizine (ZYRTEC) 5 MG tablet Take 5 mg by mouth.    . ciprofloxacin (CIPRO) 500 MG tablet Take 1 tablet (500 mg total) by mouth 2 (two) times daily. 20 tablet 0  . clindamycin (CLEOCIN) 300 MG capsule Take 1 capsule (300 mg total) by mouth 3 (three) times daily. 30 capsule 0  . EPINEPHrine 0.3 mg/0.3 mL IJ SOAJ injection Inject into the muscle.    . escitalopram (LEXAPRO) 20 MG tablet Take 20 mg by mouth.    . esomeprazole (NEXIUM) 20 MG capsule Take 20 mg by mouth.    . estradiol (VIVELLE-DOT) 0.05 MG/24HR patch Place onto the skin.    Marland Kitchen gabapentin (NEURONTIN) 100 MG capsule Take by mouth.     No current facility-administered medications on file prior to visit.     Allergies  Allergen Reactions  . Fire Ant Rash and Swelling  . Silicone Hives  . Morphine Other (See Comments)    Migraine  . Tape     Objective: There were no vitals filed for this  visit.  General: No acute distress, AAOx3  Left foot: Mild soft tissues swelling and dry scab noted to the dorsal surface of the second toe, there is decreased redness however there is still significant swelling and blanchable erythema to the toe however patient reports much improved and less painful and less red and swollen than previous visit in [place] on last week, capillary fill time <3 seconds in all digits, gross sensation present via light touch to left foot. No pain or crepitation with range of motion left foot however there is a little soreness at the metatarsophalangeal joint but otherwise there is no pain on the left foot.  No pain with calf compression.   Assessment and Plan:  Problem List Items Addressed This Visit    None    Visit Diagnoses    Cellulitis and abscess of toe of left foot    -  Primary   Relevant Medications   mupirocin ointment (BACTROBAN) 2 %   Hammertoe of left foot       Post-operative state         -Patient seen and evaluated -Advised patient to continue with cam boot and to continue with daily dressing changes consisting of Bactroban cream to the toe -Advised patient to limit her activities to necessity and to use her cam boot to protect her toe  -Continue with her oral antibiotics until she has completed  them -Advised patient return to office if there is increased warmth redness or any other signs of infection -Will plan for xray and toe check at next office visit. In the meantime, patient to call office if any issues or problems arise.   Landis Martins, DPM

## 2017-12-12 ENCOUNTER — Ambulatory Visit: Payer: BLUE CROSS/BLUE SHIELD | Admitting: Sports Medicine

## 2017-12-18 ENCOUNTER — Ambulatory Visit (INDEPENDENT_AMBULATORY_CARE_PROVIDER_SITE_OTHER): Payer: BLUE CROSS/BLUE SHIELD | Admitting: Sports Medicine

## 2017-12-18 ENCOUNTER — Ambulatory Visit (INDEPENDENT_AMBULATORY_CARE_PROVIDER_SITE_OTHER): Payer: BLUE CROSS/BLUE SHIELD

## 2017-12-18 ENCOUNTER — Encounter: Payer: Self-pay | Admitting: Sports Medicine

## 2017-12-18 VITALS — BP 130/80 | HR 60 | Temp 98.2°F | Resp 16

## 2017-12-18 DIAGNOSIS — L03032 Cellulitis of left toe: Secondary | ICD-10-CM

## 2017-12-18 DIAGNOSIS — M2042 Other hammer toe(s) (acquired), left foot: Secondary | ICD-10-CM | POA: Diagnosis not present

## 2017-12-18 DIAGNOSIS — Z9889 Other specified postprocedural states: Secondary | ICD-10-CM

## 2017-12-18 DIAGNOSIS — L02612 Cutaneous abscess of left foot: Secondary | ICD-10-CM

## 2017-12-18 NOTE — Progress Notes (Signed)
Subjective: Virginia Macias is a 47 y.o. female patient seen today in office for POV #6 (DOS 11-11-17), S/P excision of mucoid cyst at left second toe with hammertoe distal interphalangeal joint fusion with K wire. Patient completed Clinda and Cipro for cellulitis of the toe and states that toe is feeling much better admits to a small amount of itching sensation but feels like it is healing.  Patient denies calf pain, denies headache, chest pain, shortness of breath, nausea, vomiting, fever, or chills. No other issues noted.   Patient Active Problem List   Diagnosis Date Noted  . Anxiety 07/04/2017  . Family history of coronary artery disease occurring prior to 47 years of age 84/19/2018  . Mood disorder in full remission ([place]) 02/22/2016  . Thyroid nodule 10/19/2015  . Increased frequency of urination 01/02/2013  . Menorrhagia 01/02/2013  . Chronic sinusitis 12/18/2012  . Right lower quadrant pain 12/18/2012    Current Outpatient Medications on File Prior to Visit  Medication Sig Dispense Refill  . cetirizine (ZYRTEC) 5 MG tablet Take 5 mg by mouth.    . ciprofloxacin (CIPRO) 500 MG tablet Take 1 tablet (500 mg total) by mouth 2 (two) times daily. 20 tablet 0  . clindamycin (CLEOCIN) 300 MG capsule Take 1 capsule (300 mg total) by mouth 3 (three) times daily. 30 capsule 0  . EPINEPHrine 0.3 mg/0.3 mL IJ SOAJ injection Inject into the muscle.    . escitalopram (LEXAPRO) 20 MG tablet Take 20 mg by mouth.    . esomeprazole (NEXIUM) 20 MG capsule Take 20 mg by mouth.    . estradiol (VIVELLE-DOT) 0.05 MG/24HR patch Place onto the skin.    Marland Kitchen gabapentin (NEURONTIN) 100 MG capsule Take by mouth.    . mupirocin ointment (BACTROBAN) 2 % Apply to toe once daily 22 g 0   No current facility-administered medications on file prior to visit.     Allergies  Allergen Reactions  . Fire Ant Rash and Swelling  . Silicone Hives  . Morphine Other (See Comments)    Migraine  . Tape      Objective: There were no vitals filed for this visit.  General: No acute distress, AAOx3  Left foot: Mild soft tissue swelling and dry scab noted to the dorsal surface of the second toe, there is decreased redness and swelling and blanchable erythema, capillary fill time <3 seconds in all digits, gross sensation present via light touch to left foot. No pain or crepitation with range of motion left foot with a decrease pain to toe.  No pain with calf compression.  X-rays consistent with soft tissue swelling no other acute findings at area of previous surgery  Assessment and Plan:  Problem List Items Addressed This Visit    None    Visit Diagnoses    Hammertoe of left foot    -  Primary   Relevant Orders   DG Foot Complete Left   Post-operative state       Relevant Orders   DG Foot Complete Left   Cellulitis and abscess of toe of left foot         -Patient seen and evaluated -X-rays reviewed -Patient may transition back to postop shoe and then slowly to normal shoe as tolerated -Bactroban cream as needed and advised patient if itchy sensation continues with advise getting Lamisil spray to help control gets fungus and moisture however at this time no acute fungal infection noted -Advised patient return to office if there  is increased warmth redness or any other signs of infection worsen -Will plan for final toe check at next office visit. In the meantime, patient to call office if any issues or problems arise.   Landis Martins, DPM

## 2017-12-23 ENCOUNTER — Other Ambulatory Visit: Payer: Self-pay | Admitting: Sports Medicine

## 2017-12-23 DIAGNOSIS — L03032 Cellulitis of left toe: Secondary | ICD-10-CM

## 2017-12-23 DIAGNOSIS — L02612 Cutaneous abscess of left foot: Secondary | ICD-10-CM

## 2017-12-23 DIAGNOSIS — Z9889 Other specified postprocedural states: Secondary | ICD-10-CM

## 2017-12-23 DIAGNOSIS — M2042 Other hammer toe(s) (acquired), left foot: Secondary | ICD-10-CM

## 2018-01-03 ENCOUNTER — Encounter: Payer: BLUE CROSS/BLUE SHIELD | Admitting: Sports Medicine

## 2018-01-03 NOTE — Progress Notes (Signed)
DOS  11/11/2017  Left second toe excision of cyst and fusion of joint with Kwire.

## 2018-01-08 ENCOUNTER — Encounter: Payer: BLUE CROSS/BLUE SHIELD | Admitting: Sports Medicine

## 2018-01-20 ENCOUNTER — Telehealth: Payer: Self-pay | Admitting: *Deleted

## 2018-01-20 NOTE — Telephone Encounter (Signed)
[place] [facility] request postop notes. Faxed the clinicals to [place] Attn: Jerolyn Center.

## 2018-01-23 ENCOUNTER — Encounter: Payer: BLUE CROSS/BLUE SHIELD | Admitting: Sports Medicine
